# Patient Record
Sex: Male | Born: 2000 | Hispanic: No | Marital: Single
Health system: Southern US, Community
[De-identification: ages and names within clinical notes are randomized; demographics above are authoritative.]

## PROBLEM LIST (undated history)

## (undated) SURGERY — REPAIR, LACERATION, 2 OR MORE
Anesthesia: General | Laterality: Bilateral

---
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
## 2022-04-15 ENCOUNTER — Emergency Department
Admission: EM | Admit: 2022-04-15 | Discharge: 2022-04-15 | Disposition: A | Discharge status: Home / Self Care | Payer: Medicaid Other | Attending: Student in an Organized Health Care Education/Training Program | Admitting: Student in an Organized Health Care Education/Training Program

## 2022-04-15 ENCOUNTER — Emergency Department: Payer: Medicaid Other

## 2022-04-15 ENCOUNTER — Other Ambulatory Visit: Payer: Self-pay

## 2022-04-15 ENCOUNTER — Encounter: Payer: Self-pay | Admitting: Emergency Medicine

## 2022-04-15 DIAGNOSIS — W2209XA Striking against other stationary object, initial encounter: Secondary | ICD-10-CM | POA: Diagnosis not present

## 2022-04-15 DIAGNOSIS — M25532 Pain in left wrist: Secondary | ICD-10-CM

## 2022-04-15 MED ORDER — NAPROXEN 500 MG PO TABS: 500.0000 mg | ORAL_TABLET | Freq: Two times a day (BID) | ORAL | 0 refills | Status: AC

## 2022-04-15 MED ORDER — NAPROXEN 500 MG PO TABS: 500.0000 mg | ORAL_TABLET | Freq: Two times a day (BID) | ORAL | 0 refills | Status: DC
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
## 2022-04-15 NOTE — ED Provider Notes (Signed)
[facility] Provider Note    Event Date/Time   First MD Initiated Contact with Patient 04/15/22 [phone]     (approximate)   History   Wrist Pain   HPI  Anthony Reyes is a 21 y.o. male with history of behavioral disorder presents to the emergency department for treatment and evaluation of left wrist pain and swelling after punching a wall yesterday. No previous wrist fracture.  History reviewed. No pertinent past medical history.   Physical Exam   Triage Vital Signs:  Today's Vitals   04/15/22 0850 04/15/22 0851 04/15/22 0856 04/15/22 1110  BP:   (!) 145/69   Pulse:   68   Resp:   18   Temp:   98.1 F (36.7 C)   TempSrc:   Oral   SpO2:   100%   Weight:  93.9 kg    Height:  5\' 10"  (1.778 m)    PainSc: 8    9    Body mass index is 29.7 kg/m.   Most recent vital signs: Vitals:   04/15/22 0856  BP: (!) 145/69  Pulse: 68  Resp: 18  Temp: 98.1 F (36.7 C)  SpO2: 100%    General: Awake, no distress.  CV:  Good peripheral perfusion.  Radial pulse 2+ bilaterally. Resp:  Normal effort.  Abd:  No distention.  Other:  Diffuse swelling over the left wrist and hand.   ED Results / Procedures / Treatments   Labs (all labs ordered are listed, but only abnormal results are displayed) Labs Reviewed - No data to display   EKG  Not indicated.  RADIOLOGY  Image of the left wrist negative for acute bony abnormality.  I have independently reviewed and interpreted imaging as well as reviewed report from radiology.  PROCEDURES:  Critical Care performed: No  Procedures   MEDICATIONS ORDERED IN ED:  Medications - No data to display   IMPRESSION / MDM / ASSESSMENT AND PLAN / ED COURSE   I reviewed the triage vital signs and the nursing notes.  Differential diagnosis includes, but is not limited to: wrist sprain, wrist fracture  Patient's presentation is most consistent with acute complicated illness / injury requiring  diagnostic workup.  21 year old male presenting to the emergency department for treatment and evaluation after punching a wall yesterday.  See HPI for further details.  On exam, he does have some diffuse swelling of the hand.  No focal tenderness.  Imaging is pending.  Image of the left hand is negative for acute bony abnormality.  Prefabricated wrist brace was applied.  He will be prescribed Naprosyn and advised to rest, ice, elevate, and wear his splint.  If he is not improving over the week, he is to see primary care or orthopedics.       FINAL CLINICAL IMPRESSION(S) / ED DIAGNOSES   Final diagnoses:  Acute wrist pain, left     Rx / DC Orders   ED Discharge Orders          Ordered    naproxen (NAPROSYN) 500 MG tablet  2 times daily with meals,   Status:  Discontinued        04/15/22 1106    naproxen (NAPROSYN) 500 MG tablet  2 times daily with meals        04/15/22 1107             Note:  This document was prepared using Dragon voice recognition software and may include unintentional dictation errors.  Chinita Pester, FNP 04/15/22 1347    Willy Eddy, MD 04/15/22 1529

## 2022-04-17 ENCOUNTER — Telehealth: Payer: Self-pay

## 2022-04-17 NOTE — Telephone Encounter (Signed)
Patient's mother calls nurse line requesting referral to orthopedic specialist. Patient was seen in [facility] ED over the weekend for a wrist injury.   Mother states that they were instructed to follow up with Dr. Rosita Kea, however, this office does not accept his insurance.   Mother is requesting referral to a local orthopedic office.   Please advise.   Veronda Prude, RN

## 2022-04-19 NOTE — Telephone Encounter (Signed)
Called both mother and patient to try and schedule appointment.   No answer, VM not set up. Will attempt to reach at a later time.   Veronda Prude, RN

## 2022-04-19 NOTE — Telephone Encounter (Signed)
Patient scheduled for tomorrow in [facility].

## 2022-04-20 ENCOUNTER — Ambulatory Visit: Payer: Medicaid Other

## 2022-04-20 NOTE — Progress Notes (Deleted)
  SUBJECTIVE:   CHIEF COMPLAINT / HPI:   Wrist pain: Presented to ED on 7/29 for left wrist pain s/p punching a wall. Left wrist XR showed no signs of fracture or dislocation.   PERTINENT  PMH / PSH: N/A  OBJECTIVE:  There were no vitals taken for this visit.  General: NAD, pleasant, able to participate in exam Cardiac: RRR, no murmurs auscultated Respiratory: CTAB, normal WOB Abdomen: soft, non-tender, non-distended, normoactive bowel sounds Extremities: warm and well perfused, no edema or cyanosis Skin: warm and dry, no rashes noted Neuro: alert, no obvious focal deficits, speech normal Psych: Normal affect and mood  ASSESSMENT/PLAN:  No problem-specific Assessment & Plan notes found for this encounter.   No orders of the defined types were placed in this encounter.  No orders of the defined types were placed in this encounter.  No follow-ups on file. Shelby Mattocks, DO 04/20/2022, 10:52 AM PGY-***, [facility] {    This will disappear when note is signed, click to select method of visit    :1}

## 2022-10-30 ENCOUNTER — Encounter: Payer: Self-pay | Admitting: Family Medicine

## 2022-10-30 ENCOUNTER — Ambulatory Visit (INDEPENDENT_AMBULATORY_CARE_PROVIDER_SITE_OTHER): Payer: Medicaid Other | Admitting: Family Medicine

## 2022-10-30 VITALS — BP 110/68 | HR 86 | Ht 70.0 in | Wt 206.0 lb

## 2022-10-30 DIAGNOSIS — R0789 Other chest pain: Secondary | ICD-10-CM | POA: Diagnosis not present

## 2022-10-30 DIAGNOSIS — Z7689 Persons encountering health services in other specified circumstances: Secondary | ICD-10-CM

## 2022-10-30 NOTE — Progress Notes (Signed)
    SUBJECTIVE:   CHIEF COMPLAINT / HPI:   Chest discomfort -After smoking weed feels pressure on chest like his phone is lying there but it is not, only when he is lying back but does not feel it when he walks -Lasts until he falls asleep, does not have difficulty breathing -Also sometimes has sharp pain in chest when lying down and then moving in bed, once a month and started in November, goes away 30 seconds -No heart conditions that he knows of in himself, father had fluid around heart, no history of heart attacks in family  Re-establish care PMH: hospitalized as child for constipation (multiple ED visits), history of environmental allergies, no surgeries or medications Fhx: grandmother has liver failure SH: 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 but sometimes up to 6 blunts per day, 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 drinks) daily, 5-6 cigarettes a day now for 4 years, sexually active with 1 male partner (hx multiple in the past) last time 2 weeks ago, last time screened for STIs at one office visit a couple years ago  OBJECTIVE:   BP 110/68   Pulse 86   Ht 5\' 10"  (1.778 m)   Wt 206 lb (93.4 kg)   SpO2 98%   BMI 29.56 kg/m   General: Alert and oriented, in NAD Skin: Warm, dry, and intact without lesions HEENT: NCAT, EOM grossly normal, midline nasal septum Cardiac: RRR, no m/r/g appreciated Respiratory: CTAB, breathing and speaking comfortably on RA Abdominal: Soft, nontender, nondistended, normoactive bowel sounds Extremities: Moves all extremities grossly equally Neurological: No gross focal deficit Psychiatric: Appropriate mood and affect, PHQ-9 = 0  ASSESSMENT/PLAN:   Chest discomfort Unknown etiology, though consider sensation of phone being on his chest related to substance use given marijuana can act as a hallucinogen. Sharp pains in his chest that occur whenever he moves around in bed that are transient could be due to transient muscle spasm.  Reassured  against active cardiopulmonary pathology given normal exam.  Discussed how continuing tobacco and marijuana use could lead to lung problems (and likely worsening of this acute issue) in the future, and patient was amenable to information on quitting tobacco/marijuana use.  Advised patient to seek care should his pain continue to worsen or not go away.  Follow-up in 1 month to assess progression or sooner if needed.  Health maintenance Patient unsure if he could void today, will get STI screening at next visit should he desire.  Will also follow-up substance use cessation.  Will discuss obtaining hepatitis C screening at next visit, as well.  Ethelene Hal, MD [place]

## 2022-10-30 NOTE — Patient Instructions (Signed)
It was great to see you today! Here's what we talked about:  I believe your chest discomfort could be due to weed use. Your heart and lungs sound good. I would recommend thinking about cutting down on marijuana and tobacco use, as these can worsen this problem. I would be happy to chat more about stopping. Come back in about a month to follow up on this discomfort or sooner if you would like to get an STI screen before then. If your pain continues to worsen and does not go away, I would recommend returning to care.  Please let me know if you have any other questions.  Dr. Marcha Dutton

## 2022-10-31 ENCOUNTER — Encounter: Payer: Self-pay | Admitting: Family Medicine

## 2022-11-30 ENCOUNTER — Ambulatory Visit: Payer: Medicaid Other | Admitting: Family Medicine

## 2022-12-14 ENCOUNTER — Ambulatory Visit: Payer: Medicaid Other | Admitting: Family Medicine

## 2023-02-19 ENCOUNTER — Emergency Department: Payer: Self-pay

## 2023-02-19 DIAGNOSIS — R0789 Other chest pain: Secondary | ICD-10-CM | POA: Insufficient documentation

## 2023-02-19 LAB — BASIC METABOLIC PANEL
Anion gap: 11 (ref 5–15)
BUN: 8 mg/dL (ref 6–20)
CO2: 25 mmol/L (ref 22–32)
Calcium: 9.2 mg/dL (ref 8.9–10.3)
Chloride: 103 mmol/L (ref 98–111)
Creatinine, Ser: 0.95 mg/dL (ref 0.61–1.24)
GFR, Estimated: >60 mL/min (ref >60)
Glucose, Bld: 139 mg/dL — ABNORMAL HIGH (ref 70–99)
Potassium: 3.3 mmol/L — ABNORMAL LOW (ref 3.5–5.1)
Sodium: 139 mmol/L (ref 135–145)

## 2023-02-19 LAB — CBC
HCT: 43.7 % (ref 39.0–52.0)
Hemoglobin: 14.2 g/dL (ref 13.0–17.0)
MCH: 28.7 pg (ref 26.0–34.0)
MCHC: 32.5 g/dL (ref 30.0–36.0)
MCV: 88.3 fL (ref 80.0–100.0)
Platelets: 236 10*3/uL (ref 150–400)
RBC: 4.95 MIL/uL (ref 4.22–5.81)
RDW: 12.9 % (ref 11.5–15.5)
WBC: 7.1 10*3/uL (ref 4.0–10.5)
nRBC: 0 % (ref 0.0–0.2)

## 2023-02-19 LAB — TROPONIN I (HIGH SENSITIVITY): Troponin I (High Sensitivity): 3 ng/L (ref <18)

## 2023-02-19 NOTE — ED Triage Notes (Signed)
Pt states he has had chest pressure for the past few weeks, pt states he intermittently feels palpitations and has shortness of breath.

## 2023-02-20 ENCOUNTER — Emergency Department
Admission: EM | Admit: 2023-02-20 | Discharge: 2023-02-20 | Disposition: A | Discharge status: Home / Self Care | Payer: Self-pay | Attending: Emergency Medicine | Admitting: Emergency Medicine

## 2023-02-20 DIAGNOSIS — R0789 Other chest pain: Secondary | ICD-10-CM

## 2023-02-20 LAB — CHLAMYDIA/NGC RT PCR (ARMC ONLY)
Chlamydia Tr: NOT DETECTED
N gonorrhoeae: NOT DETECTED

## 2023-02-20 MED ORDER — IBUPROFEN 800 MG PO TABS: 800.0000 mg | ORAL_TABLET | Freq: Once | ORAL | Status: DC
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
## 2023-02-20 NOTE — Discharge Instructions (Signed)
Your symptoms are likely caused by pain in the chest wall due to your physical activity.  You may take ibuprofen or Tylenol as needed for the pain.  Follow-up with your primary care provider.  You will receive a call if the urine tests are positive for any diseases.  Return to the ER for new, worsening, or persistent severe chest pain, difficulty breathing, fever or chills, weakness, or any other new or worsening symptoms that concern you.

## 2023-05-04 ENCOUNTER — Other Ambulatory Visit (HOSPITAL_BASED_OUTPATIENT_CLINIC_OR_DEPARTMENT_OTHER): Payer: Self-pay | Admitting: Obstetrics & Gynecology

## 2023-12-15 ENCOUNTER — Ambulatory Visit (HOSPITAL_COMMUNITY)
Admission: EM | Admit: 2023-12-15 | Discharge: 2023-12-16 | Disposition: A | Discharge status: Home / Self Care | Payer: Self-pay | Attending: Emergency Medicine | Admitting: Emergency Medicine

## 2023-12-15 ENCOUNTER — Encounter (HOSPITAL_COMMUNITY): Payer: Self-pay | Admitting: Surgery

## 2023-12-15 ENCOUNTER — Emergency Department (HOSPITAL_COMMUNITY): Payer: Self-pay

## 2023-12-15 DIAGNOSIS — S0181XA Laceration without foreign body of other part of head, initial encounter: Secondary | ICD-10-CM

## 2023-12-15 DIAGNOSIS — S01422A Laceration with foreign body of left cheek and temporomandibular area, initial encounter: Secondary | ICD-10-CM | POA: Insufficient documentation

## 2023-12-15 DIAGNOSIS — H578A3 Foreign body sensation, bilateral eyes: Secondary | ICD-10-CM | POA: Insufficient documentation

## 2023-12-15 DIAGNOSIS — S01521A Laceration with foreign body of lip, initial encounter: Secondary | ICD-10-CM | POA: Insufficient documentation

## 2023-12-15 DIAGNOSIS — Z23 Encounter for immunization: Secondary | ICD-10-CM | POA: Insufficient documentation

## 2023-12-15 DIAGNOSIS — S01511A Laceration without foreign body of lip, initial encounter: Secondary | ICD-10-CM | POA: Insufficient documentation

## 2023-12-15 DIAGNOSIS — S01421A Laceration with foreign body of right cheek and temporomandibular area, initial encounter: Secondary | ICD-10-CM | POA: Diagnosis not present

## 2023-12-15 DIAGNOSIS — S0502XA Injury of conjunctiva and corneal abrasion without foreign body, left eye, initial encounter: Secondary | ICD-10-CM | POA: Insufficient documentation

## 2023-12-15 LAB — COMPREHENSIVE METABOLIC PANEL WITH GFR
ALT: 13 U/L (ref 0–44)
AST: 24 U/L (ref 15–41)
Albumin: 4.7 g/dL (ref 3.5–5.0)
Alkaline Phosphatase: 55 U/L (ref 38–126)
Anion gap: 11 (ref 5–15)
BUN: 17 mg/dL (ref 6–20)
CO2: 26 mmol/L (ref 22–32)
Calcium: 9.4 mg/dL (ref 8.9–10.3)
Chloride: 102 mmol/L (ref 98–111)
Creatinine, Ser: 0.95 mg/dL (ref 0.61–1.24)
GFR, Estimated: >60 mL/min (ref >60)
Glucose, Bld: 113 mg/dL — ABNORMAL HIGH (ref 70–99)
Potassium: 3.7 mmol/L (ref 3.5–5.1)
Sodium: 139 mmol/L (ref 135–145)
Total Bilirubin: 0.8 mg/dL (ref 0.0–1.2)
Total Protein: 7.4 g/dL (ref 6.5–8.1)

## 2023-12-15 LAB — PROTIME-INR
INR: 0.9 (ref 0.8–1.2)
Prothrombin Time: 12.2 s (ref 11.4–15.2)

## 2023-12-15 LAB — I-STAT CHEM 8, ED
BUN: 20 mg/dL (ref 6–20)
Calcium, Ion: 1.15 mmol/L (ref 1.15–1.40)
Chloride: 103 mmol/L (ref 98–111)
Creatinine, Ser: 1 mg/dL (ref 0.61–1.24)
Glucose, Bld: 110 mg/dL — ABNORMAL HIGH (ref 70–99)
HCT: 42 % (ref 39.0–52.0)
Hemoglobin: 14.3 g/dL (ref 13.0–17.0)
Potassium: 3.7 mmol/L (ref 3.5–5.1)
Sodium: 141 mmol/L (ref 135–145)
TCO2: 28 mmol/L (ref 22–32)

## 2023-12-15 LAB — SAMPLE TO BLOOD BANK

## 2023-12-15 LAB — CBC
HCT: 41.4 % (ref 39.0–52.0)
Hemoglobin: 14 g/dL (ref 13.0–17.0)
MCH: 29.5 pg (ref 26.0–34.0)
MCHC: 33.8 g/dL (ref 30.0–36.0)
MCV: 87.3 fL (ref 80.0–100.0)
Platelets: 281 10*3/uL (ref 150–400)
RBC: 4.74 MIL/uL (ref 4.22–5.81)
RDW: 13.2 % (ref 11.5–15.5)
WBC: 12.4 10*3/uL — ABNORMAL HIGH (ref 4.0–10.5)
nRBC: 0 % (ref 0.0–0.2)

## 2023-12-15 LAB — URINALYSIS, ROUTINE W REFLEX MICROSCOPIC
Bilirubin Urine: NEGATIVE
Glucose, UA: NEGATIVE mg/dL
Hgb urine dipstick: NEGATIVE
Ketones, ur: 5 mg/dL — AB
Leukocytes,Ua: NEGATIVE
Nitrite: NEGATIVE
Protein, ur: NEGATIVE mg/dL
Specific Gravity, Urine: >1.046 — ABNORMAL HIGH (ref 1.005–1.030)
pH: 5 (ref 5.0–8.0)

## 2023-12-15 LAB — I-STAT CG4 LACTIC ACID, ED: Lactic Acid, Venous: 1.4 mmol/L (ref 0.5–1.9)

## 2023-12-15 LAB — ETHANOL: Alcohol, Ethyl (B): <10 mg/dL (ref <10)

## 2023-12-15 MED ORDER — TETANUS-DIPHTH-ACELL PERTUSSIS 5-2.5-18.5 LF-MCG/0.5 IM SUSY
0.5000 mL | PREFILLED_SYRINGE | Freq: Once | INTRAMUSCULAR | Status: AC
  Administered 2023-12-15: 0.5 mL via INTRAMUSCULAR

## 2023-12-15 MED ORDER — IOHEXOL 350 MG/ML SOLN
75.0000 mL | Freq: Once | INTRAVENOUS | Status: AC | PRN
  Administered 2023-12-15: 75 mL via INTRAVENOUS

## 2023-12-15 MED ORDER — LIDOCAINE-EPINEPHRINE 1 %-1:100000 IJ SOLN
30.0000 mL | Freq: Once | INTRAMUSCULAR | Status: DC
  Filled 2023-12-15: qty 1

## 2023-12-15 MED ORDER — EPINEPHRINE 1 MG/10ML IJ SOSY
PREFILLED_SYRINGE | INTRAMUSCULAR | Status: AC
  Filled 2023-12-15: qty 10

## 2023-12-15 MED ORDER — SODIUM CHLORIDE 0.9 % IV SOLN
3.0000 g | Freq: Once | INTRAVENOUS | Status: AC
  Administered 2023-12-15: 3 g via INTRAVENOUS

## 2023-12-15 MED ORDER — MORPHINE SULFATE (PF) 4 MG/ML IV SOLN
4.0000 mg | Freq: Once | INTRAVENOUS | Status: AC
  Administered 2023-12-15: 4 mg via INTRAVENOUS

## 2023-12-15 MED ORDER — MORPHINE SULFATE (PF) 2 MG/ML IV SOLN
INTRAVENOUS | Status: AC
  Filled 2023-12-15: qty 2

## 2023-12-15 MED ORDER — ERYTHROMYCIN 5 MG/GM OP OINT: TOPICAL_OINTMENT | Freq: Four times a day (QID) | OPHTHALMIC | Status: DC
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
## 2023-12-15 NOTE — ED Notes (Signed)
Back to room from CT  

## 2023-12-15 NOTE — ED Notes (Signed)
Optometry at bedside

## 2023-12-15 NOTE — ED Notes (Signed)
 X-ray to bedside

## 2023-12-15 NOTE — Anesthesia Preprocedure Evaluation (Signed)
 Anesthesia Evaluation  Patient identified by MRN, date of birth, ID band Patient awake    Reviewed: Allergy & Precautions, NPO status , Patient's Chart, lab work & pertinent test results  Airway Mallampati: III  TM Distance: >3 FB Neck ROM: Full    Dental  (+) Dental Advisory Given, Teeth Intact   Pulmonary neg pulmonary ROS   Pulmonary exam normal breath sounds clear to auscultation       Cardiovascular negative cardio ROS Normal cardiovascular exam Rhythm:Regular Rate:Normal     Neuro/Psych negative neurological ROS     GI/Hepatic negative GI ROS, Neg liver ROS,,,  Endo/Other  negative endocrine ROS    Renal/GU negative Renal ROS     Musculoskeletal negative musculoskeletal ROS (+)    Abdominal   Peds  Hematology negative hematology ROS (+)   Anesthesia Other Findings   Reproductive/Obstetrics                             Anesthesia Physical Anesthesia Plan  ASA: 1  Anesthesia Plan: General   Post-op Pain Management: Ofirmev IV (intra-op)* and Toradol IV (intra-op)*   Induction: Intravenous  PONV Risk Score and Plan: 3 and Ondansetron, Dexamethasone, Treatment may vary due to age or medical condition and Midazolam  Airway Management Planned: Oral ETT and Video Laryngoscope Planned  Additional Equipment:   Intra-op Plan:   Post-operative Plan: Extubation in OR  Informed Consent: I have reviewed the patients History and Physical, chart, labs and discussed the procedure including the risks, benefits and alternatives for the proposed anesthesia with the patient or authorized representative who has indicated his/her understanding and acceptance.     Dental advisory given  Plan Discussed with: CRNA  Anesthesia Plan Comments:        Anesthesia Quick Evaluation
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
## 2023-12-15 NOTE — Progress Notes (Signed)
 Orthopedic Tech Progress Note Patient Details:  Anthony Reyes 2001/09/09 [id number]  Patient ID: Anthony Reyes, male   DOB: [birth year], 22 y.o.   MRN: [id number] I attended trauma page. Trinna Post 12/15/2023, 8:58 PM
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
## 2023-12-15 NOTE — Procedures (Shared)
 Operative Note   DATE OF OPERATION: ***  LOCATION: ***  SURGICAL DIVISION: Plastic Surgery  PREOPERATIVE DIAGNOSES:  ***  POSTOPERATIVE DIAGNOSES:  same  PROCEDURE:  ***  SURGEON: Glenna Fellows MD MBA  ASSISTANT: ***  ANESTHESIA:  General.   EBL: ***  COMPLICATIONS: None immediate.   INDICATIONS FOR PROCEDURE:  The patient, Anthony Reyes, is a 23 y.o. male born on July 03, 2001, is here for ***   FINDINGS: ***  DESCRIPTION OF PROCEDURE:  The patient's operative site was marked with the patient in the preoperative area. The patient was taken to the operating room. SCDs were placed and IV antibiotics were given. The patient's operative site was prepped and draped in a sterile fashion. A time out was performed and all information was confirmed to be correct.  ***  The patient was allowed to wake from anesthesia, extubated and taken to the recovery room in satisfactory condition.   SPECIMENS: ***  DRAINS: ***  Glenna Fellows, MD MBA Plastic & Reconstructive Surgery  Office/ physician access line after hours [phone]

## 2023-12-15 NOTE — ED Notes (Signed)
 Patient to CT.
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
## 2023-12-15 NOTE — ED Notes (Signed)
 Pt calm, listening to music. Pt continues to suction as needed.

## 2023-12-15 NOTE — ED Notes (Signed)
 Per Dr. Marthenia Rolling, delay in OR - requested solution of epi and saline to apply to wounds for hemorrhage control. Assisted with dressing placement. Parents at bedside. Plan of care for operative repair and anticipate discharge following procedure.

## 2023-12-16 ENCOUNTER — Encounter (HOSPITAL_COMMUNITY)
Admission: EM | Disposition: A | Discharge status: Home / Self Care | Payer: Self-pay | Source: Home / Self Care | Attending: Emergency Medicine

## 2023-12-16 ENCOUNTER — Other Ambulatory Visit: Payer: Self-pay

## 2023-12-16 ENCOUNTER — Emergency Department (HOSPITAL_COMMUNITY): Payer: Self-pay | Admitting: Anesthesiology

## 2023-12-16 ENCOUNTER — Emergency Department (HOSPITAL_BASED_OUTPATIENT_CLINIC_OR_DEPARTMENT_OTHER): Payer: Self-pay | Admitting: Anesthesiology

## 2023-12-16 DIAGNOSIS — S01511A Laceration without foreign body of lip, initial encounter: Secondary | ICD-10-CM

## 2023-12-16 HISTORY — PX: DEBRIDEMENT AND CLOSURE WOUND: SHX5614

## 2023-12-16 SURGERY — DEBRIDEMENT, WOUND, WITH CLOSURE
Anesthesia: General

## 2023-12-16 MED ORDER — EPINEPHRINE HCL (NASAL) 0.1 % NA SOLN
NASAL | Status: DC | PRN
Start: 2023-12-16 — End: 2023-12-16
  Administered 2023-12-16: 1 mL via TOPICAL

## 2023-12-16 MED ORDER — ROCURONIUM BROMIDE 100 MG/10ML IV SOLN
INTRAVENOUS | Status: DC | PRN
Start: 2023-12-16 — End: 2023-12-16
  Administered 2023-12-16: 50 mg via INTRAVENOUS
  Administered 2023-12-16: 10 mg via INTRAVENOUS

## 2023-12-16 MED ORDER — MIDAZOLAM HCL 2 MG/2ML IJ SOLN
INTRAMUSCULAR | Status: AC
Start: 2023-12-16 — End: ?
  Filled 2023-12-16: qty 2

## 2023-12-16 MED ORDER — POLYETHYLENE GLYCOL 3350 17 G PO PACK
17.0000 g | PACK | Freq: Every day | ORAL | 0 refills | Status: DC
Start: 2023-12-16 — End: 2023-12-16
  Filled 2023-12-16: qty 14, 14d supply, fill #0

## 2023-12-16 MED ORDER — DEXAMETHASONE SODIUM PHOSPHATE 4 MG/ML IJ SOLN
INTRAMUSCULAR | Status: DC | PRN
Start: 2023-12-16 — End: 2023-12-16
  Administered 2023-12-16: 10 mg via INTRAVENOUS

## 2023-12-16 MED ORDER — ERYTHROMYCIN 5 MG/GM OP OINT: 1.0000 | TOPICAL_OINTMENT | Freq: Four times a day (QID) | OPHTHALMIC | 0 refills | Status: DC

## 2023-12-16 MED ORDER — ERYTHROMYCIN 5 MG/GM OP OINT
1.0000 | TOPICAL_OINTMENT | Freq: Four times a day (QID) | OPHTHALMIC | 0 refills | Status: DC
  Filled 2023-12-16: qty 21, 6d supply, fill #0

## 2023-12-16 MED ORDER — BACITRACIN ZINC 500 UNIT/GM EX OINT
TOPICAL_OINTMENT | CUTANEOUS | Status: AC
  Filled 2023-12-16: qty 28.35

## 2023-12-16 MED ORDER — PROPOFOL 10 MG/ML IV BOLUS
INTRAVENOUS | Status: DC | PRN
  Administered 2023-12-16: 200 mg via INTRAVENOUS

## 2023-12-16 MED ORDER — ONDANSETRON 4 MG PO TBDP: 4.0000 mg | ORAL_TABLET | Freq: Three times a day (TID) | ORAL | 0 refills | Status: AC | PRN

## 2023-12-16 MED ORDER — AMOXICILLIN-POT CLAVULANATE 875-125 MG PO TABS: 1.0000 | ORAL_TABLET | Freq: Two times a day (BID) | ORAL | 0 refills | Status: DC

## 2023-12-16 MED ORDER — ACETAMINOPHEN 160 MG/5ML PO SOLN: 1000.0000 mg | Freq: Four times a day (QID) | ORAL | 0 refills | Status: AC | PRN

## 2023-12-16 MED ORDER — BUPIVACAINE-EPINEPHRINE 0.25% -1:200000 IJ SOLN
INTRAMUSCULAR | Status: DC | PRN
  Administered 2023-12-16: 10 mL

## 2023-12-16 MED ORDER — OXYCODONE HCL 5 MG/5ML PO SOLN
5.0000 mg | ORAL | 0 refills | Status: DC | PRN
  Filled 2023-12-16: qty 50, 2d supply, fill #0

## 2023-12-16 MED ORDER — CHLORHEXIDINE GLUCONATE 0.12 % MT SOLN: 15.0000 mL | Freq: Two times a day (BID) | OROMUCOSAL | 0 refills | Status: AC

## 2023-12-16 MED ORDER — BACITRACIN ZINC 500 UNIT/GM EX OINT
TOPICAL_OINTMENT | CUTANEOUS | Status: DC | PRN
  Administered 2023-12-16: 1 via TOPICAL

## 2023-12-16 MED ORDER — KETOROLAC TROMETHAMINE 30 MG/ML IJ SOLN: 30.0000 mg | Freq: Once | INTRAMUSCULAR | Status: DC | PRN

## 2023-12-16 MED ORDER — LIDOCAINE HCL (CARDIAC) PF 50 MG/5ML IV SOSY
PREFILLED_SYRINGE | INTRAVENOUS | Status: DC | PRN
  Administered 2023-12-16: 100 mg via INTRAVENOUS

## 2023-12-16 MED ORDER — MEPERIDINE HCL 25 MG/ML IJ SOLN: 6.2500 mg | INTRAMUSCULAR | Status: DC | PRN

## 2023-12-16 MED ORDER — 0.9 % SODIUM CHLORIDE (POUR BTL) OPTIME
TOPICAL | Status: DC | PRN
  Administered 2023-12-16: 3000 mL
  Administered 2023-12-16: 500 mL

## 2023-12-16 MED ORDER — MIDAZOLAM HCL 5 MG/5ML IJ SOLN
INTRAMUSCULAR | Status: DC | PRN
  Administered 2023-12-16: 2 mg via INTRAVENOUS

## 2023-12-16 MED ORDER — ONDANSETRON 4 MG PO TBDP
4.0000 mg | ORAL_TABLET | Freq: Three times a day (TID) | ORAL | 0 refills | Status: DC | PRN
  Filled 2023-12-16: qty 14, 5d supply, fill #0

## 2023-12-16 MED ORDER — SUGAMMADEX SODIUM 200 MG/2ML IV SOLN
INTRAVENOUS | Status: DC | PRN
  Administered 2023-12-16: 200 mg via INTRAVENOUS

## 2023-12-16 MED ORDER — HYDROMORPHONE HCL 1 MG/ML IJ SOLN: 0.2500 mg | INTRAMUSCULAR | Status: DC | PRN

## 2023-12-16 MED ORDER — AMOXICILLIN-POT CLAVULANATE 875-125 MG PO TABS: 1.0000 | ORAL_TABLET | Freq: Two times a day (BID) | ORAL | 0 refills | Status: AC

## 2023-12-16 MED ORDER — POLYETHYLENE GLYCOL 3350 17 G PO PACK: 17.0000 g | PACK | Freq: Every day | ORAL | 0 refills | Status: DC

## 2023-12-16 MED ORDER — OXYCODONE HCL 5 MG/5ML PO SOLN: 5.0000 mg | ORAL | 0 refills | Status: AC | PRN

## 2023-12-16 MED ORDER — IBUPROFEN 100 MG/5ML PO SUSP: 600.0000 mg | Freq: Four times a day (QID) | ORAL | 0 refills | Status: AC

## 2023-12-16 MED ORDER — ONDANSETRON HCL 4 MG/2ML IJ SOLN
INTRAMUSCULAR | Status: DC | PRN
  Administered 2023-12-16: 4 mg via INTRAVENOUS

## 2023-12-16 MED ORDER — ACETAMINOPHEN 10 MG/ML IV SOLN
INTRAVENOUS | Status: AC
  Filled 2023-12-16: qty 100

## 2023-12-16 MED ORDER — PROPOFOL 10 MG/ML IV BOLUS
INTRAVENOUS | Status: AC
  Filled 2023-12-16: qty 20

## 2023-12-16 MED ORDER — IBUPROFEN 100 MG/5ML PO SUSP
600.0000 mg | Freq: Four times a day (QID) | ORAL | 0 refills | Status: DC
  Filled 2023-12-16: qty 944, 8d supply, fill #0

## 2023-12-16 MED ORDER — ONDANSETRON 4 MG PO TBDP: 4.0000 mg | ORAL_TABLET | Freq: Three times a day (TID) | ORAL | 0 refills | Status: DC | PRN

## 2023-12-16 MED ORDER — POLYETHYLENE GLYCOL 3350 17 G PO PACK: 17.0000 g | PACK | Freq: Every day | ORAL | 0 refills | Status: AC

## 2023-12-16 MED ORDER — EPINEPHRINE PF 1 MG/ML IJ SOLN
INTRAMUSCULAR | Status: AC
  Filled 2023-12-16: qty 1

## 2023-12-16 MED ORDER — BUPIVACAINE-EPINEPHRINE (PF) 0.25% -1:200000 IJ SOLN
INTRAMUSCULAR | Status: AC
  Filled 2023-12-16: qty 30

## 2023-12-16 MED ORDER — CHLORHEXIDINE GLUCONATE 0.12 % MT SOLN: 15.0000 mL | Freq: Two times a day (BID) | OROMUCOSAL | 0 refills | Status: DC

## 2023-12-16 MED ORDER — ACETAMINOPHEN 10 MG/ML IV SOLN
INTRAVENOUS | Status: DC | PRN
  Administered 2023-12-16: 1000 mg via INTRAVENOUS

## 2023-12-16 MED ORDER — OXYCODONE HCL 5 MG/5ML PO SOLN: 5.0000 mg | ORAL | 0 refills | Status: DC | PRN

## 2023-12-16 MED ORDER — FENTANYL CITRATE (PF) 100 MCG/2ML IJ SOLN
INTRAMUSCULAR | Status: DC | PRN
  Administered 2023-12-16: 100 ug via INTRAVENOUS
  Administered 2023-12-16 (×3): 50 ug via INTRAVENOUS

## 2023-12-16 MED ORDER — FENTANYL CITRATE (PF) 250 MCG/5ML IJ SOLN
INTRAMUSCULAR | Status: AC
  Filled 2023-12-16: qty 5

## 2023-12-16 MED ORDER — KETOROLAC TROMETHAMINE 30 MG/ML IJ SOLN
INTRAMUSCULAR | Status: DC | PRN
  Administered 2023-12-16: 30 mg via INTRAVENOUS

## 2023-12-16 MED ORDER — IBUPROFEN 100 MG/5ML PO SUSP: 600.0000 mg | Freq: Four times a day (QID) | ORAL | 0 refills | Status: DC

## 2023-12-16 MED ORDER — SUCCINYLCHOLINE CHLORIDE 20 MG/ML IJ SOLN
INTRAMUSCULAR | Status: DC | PRN
  Administered 2023-12-16: 120 mg via INTRAVENOUS

## 2023-12-16 MED ORDER — ACETAMINOPHEN 160 MG/5ML PO SOLN
1000.0000 mg | Freq: Four times a day (QID) | ORAL | 0 refills | Status: DC
  Filled 2023-12-16: qty 944, 8d supply, fill #0

## 2023-12-16 MED ORDER — AMOXICILLIN-POT CLAVULANATE 875-125 MG PO TABS
1.0000 | ORAL_TABLET | Freq: Two times a day (BID) | ORAL | 0 refills | Status: DC
  Filled 2023-12-16: qty 18, 9d supply, fill #0

## 2023-12-16 MED ORDER — DROPERIDOL 2.5 MG/ML IJ SOLN: 0.6250 mg | Freq: Once | INTRAMUSCULAR | Status: DC | PRN

## 2023-12-16 MED ORDER — ACETAMINOPHEN 160 MG/5ML PO SOLN: 1000.0000 mg | Freq: Four times a day (QID) | ORAL | 0 refills | Status: DC

## 2023-12-16 MED ORDER — LACTATED RINGERS IV SOLN: INTRAVENOUS | Status: DC | PRN

## 2023-12-16 SURGICAL SUPPLY — 27 items
BAG COUNTER SPONGE SURGICOUNT (BAG) ×1 IMPLANT
BLADE SURG 15 STRL LF DISP TIS (BLADE) IMPLANT
COVER SURGICAL LIGHT HANDLE (MISCELLANEOUS) ×1 IMPLANT
DERMABOND ADVANCED .7 DNX12 (GAUZE/BANDAGES/DRESSINGS) IMPLANT
DRAPE HALF SHEET 40X57 (DRAPES) IMPLANT
ELECT COATED BLADE 2.86 ST (ELECTRODE) ×1 IMPLANT
GAUZE 4X4 16PLY ~~LOC~~+RFID DBL (SPONGE) IMPLANT
GLOVE BIO SURGEON STRL SZ7.5 (GLOVE) ×1 IMPLANT
GLOVE BIOGEL PI IND STRL 8 (GLOVE) ×1 IMPLANT
GOWN STRL REUS W/ TWL LRG LVL3 (GOWN DISPOSABLE) ×1 IMPLANT
GOWN STRL REUS W/ TWL XL LVL3 (GOWN DISPOSABLE) ×1 IMPLANT
KIT BASIN OR (CUSTOM PROCEDURE TRAY) ×1 IMPLANT
KIT TURNOVER KIT B (KITS) ×1 IMPLANT
NDL 27GX1/2 REG BEVEL ECLIP (NEEDLE) ×1 IMPLANT
NDL FILTER BLUNT 18X1 1/2 (NEEDLE) IMPLANT
NEEDLE 27GX1/2 REG BEVEL ECLIP (NEEDLE) ×1 IMPLANT
NEEDLE FILTER BLUNT 18X1 1/2 (NEEDLE) ×1 IMPLANT
NS IRRIG 1000ML POUR BTL (IV SOLUTION) ×1 IMPLANT
PAD ARMBOARD POSITIONER FOAM (MISCELLANEOUS) ×2 IMPLANT
PENCIL SMOKE EVACUATOR (MISCELLANEOUS) ×1 IMPLANT
SUT SILK 3 0 SH 30 (SUTURE) IMPLANT
SUT VICRYL 4-0 PS2 18IN ABS (SUTURE) IMPLANT
SUT VICRYL RAPID 5 0 P 3 (SUTURE) IMPLANT
SYR 3ML LL SCALE MARK (SYRINGE) IMPLANT
SYR CONTROL 10ML LL (SYRINGE) ×1 IMPLANT
TOWEL GREEN STERILE (TOWEL DISPOSABLE) ×1 IMPLANT
TRAY ENT MC OR (CUSTOM PROCEDURE TRAY) ×1 IMPLANT

## 2023-12-16 NOTE — Progress Notes (Signed)
 Call placed to Dr. Marthenia Rolling by Atha Starks, RN to inform that prescriptions needed to be sent to 24 hour pharmacy. Dr. Marthenia Rolling en route home and will change pharmacy as soon as he arrives. Second call placed to Dr. Marthenia Rolling at [phone] to ensure that pharmacy was being changed and to remind him that he wanted to order Peridex mouth rinse for patient but it was not showing on AVS. Dr. Marthenia Rolling was able to get the AVS updated and prescriptions re-sent to the 24 hour pharmacy. Patient was assisted to get dressed and moved to wheelchair for IV removal. He tolerated all procedures well. Patient left PACU at 0446 via wheelchair accompanied by his mother and this RN. Discharge instructions provided to patients mother; understanding verbalized. Family appreciative of care provided. Patient in no distress at time of discharge.

## 2023-12-16 NOTE — Discharge Instructions (Addendum)
 Surgery Discharge Instructions:  1. Call your doctor or go to the emergency room if you have:  - Fever of 101 degrees or higher - Uncontrollable pain or pain that has increased greatly since your surgery - Increased redness around your incision (cut); incision pulling apart; thick, white drainage (pus), bleeding, or a large amount of fluid from your incision. - Nausea and vomiting that continue despite medications - Urine output less than is normal for you  - Chest pain/shortness of breath  2. Wound Care/Dressings/Drain Instructions:   - Keep the lacerated areas of the lips and mouth clean. You may clean the skin with antibacterial soap and water daily and shower. Do not scrub. Avoid activities that pull against the stitches - like opening the mouth wide, etc.  - Avoid using a toothbrush while your gums are healing. During the first 2 weeks, use the prescription mouthwash that was sent to your pharmacy (twice daily). This will keep the inside of your mouth clean. Gently swish and spit.  - Avoid salty, citrus, spicy, acidic or other foods that may sting. Eat a soft diet only until instructed in follow up.  - Keep your head elevated on multiple pillows or sleep in a recliner to decrease the amount of swelling in your face - Be sure to take antibiotics as prescribed. These have been sent to the pharmacy  - AVOID NICOTINE IN ALL FORMS (VAPE, POUCHES, PATCHES, CIGARETTES, and so on) as this is likely to cause you to have an infection or poor wound healing.  - For best pain control, take tylenol and motrin on a schedule. These have been sent to your pharmacy. It is best to take these even if you are not in severe pain, since they 'work together.' Take them as follows for best effect:  tylenol (up to 1000 mg every 6 hours) and ibuprofen (600 mg every 6 hours). Alternate these medications every 3 hours (tylenol at 12 and 6 o'clock, ibuprofen at 3 and 9 o'clock) for maximum relief.  - Oxycodone is a  stronger pain medicine and should be taken if the above medicines are not working. You cannot drive or operate machinery on this medicine. It is addictive and can be dangerous if too much is taken. Use only as directed.  - Anti-nausea and stool softener has also been sent to your pharmacy    3. Follow Up:  - A follow up appointment should be scheduled for you one to two weeks after discharge, and our secretaries will be contacting you with the details of that appointment. However, If you do not hear about your appoinment in 3 business days, please call the provided surgery clinic number and confirm/schedule your appointment.     4. Activity/Restrictions:  - Resume your regular activities, as tolerated  5. Diet: Eat a soft diet until instructed otherwise. This means no crunchy, hard, foods; only foods that do not require much chewing. Avoid salty, spicy, acidic, and citrus foods.   -To maximize chances of healing, by Ensure or other brand protein shakes.  Another option is protein powder which can be bought and mixed with milk.  Drinking protein shakes will boost your wound healing while you are required to eat a soft diet.   6. Additional Instructions: - Please take an over the counter stool softener while taking narcotic pain medication - DO NOT MIX NARCOTIC PAIN MEDICATIONS OR TAKE NARCOTIC PRESCRIPTIONS AT THE SAME TIME (PERCODET, LORTAB, ROXICODONE, ETC.) - DO NOT DRIVE OR OPERATE HEAVY MACHINERY  WHILE ON NARCOTICS  - DO NOT TAKE MORE THAN 4 GRAMS (4000mg ) OF TYLENOL (ACETAMINOPHEN) IN 24 HOURS ________________________________________________________________ Department of Plastic Surgery Contact Info: Plastic Surgery Office Contact for daytime hours - [phone] Office/ physician access line after hours [phone]

## 2023-12-16 NOTE — ED Notes (Signed)
Pt transport to PACU.
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
## 2023-12-16 NOTE — Transfer of Care (Signed)
 Immediate Anesthesia Transfer of Care Note  Patient: Anthony Reyes  Procedure(s) Performed: DEBRIDEMENT, WOUND, WITH CLOSURE  Patient Location: PACU  Anesthesia Type:General  Level of Consciousness: drowsy and responds to stimulation  Airway & Oxygen Therapy: Patient Spontanous Breathing and Patient connected to nasal cannula oxygen  Post-op Assessment: Report given to RN and Post -op Vital signs reviewed and stable  Post vital signs: Reviewed and stable  Last Vitals:  Vitals Value Taken Time  BP 136/69 12/16/23 0245  Temp    Pulse 72 12/16/23 0248  Resp 17 12/16/23 0248  SpO2 98 % 12/16/23 0248  Vitals shown include unfiled device data.  Last Pain:  Vitals:   12/15/23 2142  TempSrc:   PainSc: 10-Worst pain ever         Complications: No notable events documented.
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
## 2023-12-16 NOTE — Progress Notes (Signed)
 Patient resting quietly but responds quickly and answers appropriately. Informed him that the surgeon stated he could go home if he wanted to or he could stay overnight. Patient clearly stated that he preferred to go home. He is maintaining his O2 sats on room air and his vital signs are stable. He currently denies pain.

## 2023-12-16 NOTE — Anesthesia Postprocedure Evaluation (Signed)
 Anesthesia Post Note  Patient: Anthony Reyes  Procedure(s) Performed: DEBRIDEMENT, WOUND, WITH CLOSURE     Patient location during evaluation: PACU Anesthesia Type: General Level of consciousness: sedated and patient cooperative Pain management: pain level controlled Vital Signs Assessment: post-procedure vital signs reviewed and stable Respiratory status: spontaneous breathing Cardiovascular status: stable Anesthetic complications: no  No notable events documented.  Last Vitals:  Vitals:   12/16/23 0415 12/16/23 0430  BP: 130/76 128/71  Pulse: 83 79  Resp: 14 12  Temp:  37.1 C  SpO2: 97% 95%    Last Pain:  Vitals:   12/16/23 0430  TempSrc:   PainSc: 0-No pain                 Lewie Loron

## 2023-12-16 NOTE — Anesthesia Procedure Notes (Addendum)
 Procedure Name: Intubation Date/Time: 12/16/2023 12:41 AM  Performed by: Edmonia Caprio, CRNAPre-anesthesia Checklist: Patient identified, Emergency Drugs available, Suction available and Patient being monitored Patient Re-evaluated:Patient Re-evaluated prior to induction Oxygen Delivery Method: Circle system utilized Preoxygenation: Pre-oxygenation with 100% oxygen Induction Type: IV induction and Rapid sequence Laryngoscope Size: Glidescope, 4 and 3 Grade View: Grade I Tube type: Oral Tube size: 7.5 mm Number of attempts: 1 Airway Equipment and Method: Stylet, Oral airway and Video-laryngoscopy Placement Confirmation: ETT inserted through vocal cords under direct vision, positive ETCO2 and breath sounds checked- equal and bilateral Secured at: 24 cm Tube secured with: Tape Dental Injury: Teeth and Oropharynx as per pre-operative assessment

## 2023-12-17 ENCOUNTER — Encounter (HOSPITAL_COMMUNITY): Payer: Self-pay

## 2023-12-17 ENCOUNTER — Encounter: Payer: Self-pay | Admitting: Family Medicine

## 2023-12-17 ENCOUNTER — Other Ambulatory Visit (HOSPITAL_COMMUNITY): Payer: Self-pay

## 2023-12-18 LAB — SURGICAL PATHOLOGY, GROSS ONLY (NOT ARMC)

## 2024-04-14 ENCOUNTER — Ambulatory Visit: Payer: MEDICAID | Admitting: Family Medicine
